# Patient Record
Sex: Male | Born: 1970 | Hispanic: No | Marital: Single | Smoking: Current every day smoker
Health system: Southern US, Community
[De-identification: ages and names within clinical notes are randomized; demographics above are authoritative.]

---

## 2000-04-02 ENCOUNTER — Emergency Department (HOSPITAL_COMMUNITY): Admission: EM | Admit: 2000-04-02 | Discharge: 2000-04-02 | Payer: Self-pay | Admitting: Emergency Medicine

## 2000-04-02 ENCOUNTER — Encounter: Payer: Self-pay | Admitting: Emergency Medicine

## 2006-03-14 ENCOUNTER — Emergency Department (HOSPITAL_COMMUNITY): Admission: EM | Admit: 2006-03-14 | Discharge: 2006-03-14 | Payer: Self-pay | Admitting: Emergency Medicine

## 2007-09-10 ENCOUNTER — Emergency Department (HOSPITAL_COMMUNITY): Admission: EM | Admit: 2007-09-10 | Discharge: 2007-09-10 | Payer: Self-pay | Admitting: Emergency Medicine

## 2007-10-07 ENCOUNTER — Ambulatory Visit: Payer: Self-pay | Admitting: Family Medicine

## 2007-10-07 DIAGNOSIS — R21 Rash and other nonspecific skin eruption: Secondary | ICD-10-CM | POA: Insufficient documentation

## 2007-10-07 DIAGNOSIS — B369 Superficial mycosis, unspecified: Secondary | ICD-10-CM | POA: Insufficient documentation

## 2008-04-27 ENCOUNTER — Ambulatory Visit: Payer: Self-pay | Admitting: Family Medicine

## 2008-04-27 ENCOUNTER — Telehealth: Payer: Self-pay | Admitting: *Deleted

## 2008-04-27 DIAGNOSIS — J029 Acute pharyngitis, unspecified: Secondary | ICD-10-CM

## 2009-09-24 ENCOUNTER — Emergency Department (HOSPITAL_COMMUNITY): Admission: EM | Admit: 2009-09-24 | Discharge: 2009-09-24 | Payer: Self-pay | Admitting: Emergency Medicine

## 2009-12-01 ENCOUNTER — Ambulatory Visit: Payer: Self-pay | Admitting: Family Medicine

## 2010-06-05 NOTE — Assessment & Plan Note (Signed)
Summary: sore throat/kh   Vital Signs:  Patient profile:   40 year old male Height:      66 inches Weight:      199 pounds BMI:     32.24 Temp:     102.8 degrees F oral Pulse rate:   121 / minute BP sitting:   96 / 58  (left arm) Cuff size:   regular  Vitals Entered By: Tessie Fass CMA (December 01, 2009 3:43 PM) CC: sore throat x 2 days   Primary Care Provider:  Marisue Ivan  MD  CC:  sore throat x 2 days.  History of Present Illness: Sore throat and fever for two days.  Allergies: No Known Drug Allergies  Physical Exam  Mouth:  exudative tonsils   Impression & Recommendations:  Problem # 1:  SORE THROAT (ICD-462) + rapid strep, treat with IM bicillin Orders: Rapid Strep-[facility] (60454) Bicillin LA 1.2 million units Injection (U9811)  Complete Medication List: 1)  Selenium Sulfide 2.5 % Lotn (Selenium sulfide) .... Apply to affected area daily x7 days  Other Orders: [facility]- Est Level  3 (91478)  Laboratory Results  Date/Time Received: December 01, 2009 3:51 PM  Date/Time Reported: December 01, 2009 3:57 PM   Other Tests  Rapid Strep: positive Comments: ...........test performed by...........Marland KitchenTerese Door, CMA      Medication Administration  Injection # 1:    Medication: Bicillin LA 1.2 million units Injection    Diagnosis: SORE THROAT (ICD-462)    Route: IM    Site: RUOQ gluteus    Exp Date: 04/2012    Lot #: [id number]    Mfr: king    Patient tolerated injection without complications    Given by: Tessie Fass CMA (December 01, 2009 4:34 PM)  Orders Added: 1)  Rapid Strep-[facility] [13086] 2)  Bicillin LA 1.2 million units Injection [J0561] 3)  [facility]- Est Level  3 [57846]

## 2010-06-24 ENCOUNTER — Encounter: Payer: Self-pay | Admitting: *Deleted

## 2010-09-10 ENCOUNTER — Ambulatory Visit: Payer: Self-pay | Admitting: Family Medicine

## 2010-09-11 ENCOUNTER — Encounter: Payer: Self-pay | Admitting: Family Medicine

## 2010-09-11 ENCOUNTER — Ambulatory Visit (INDEPENDENT_AMBULATORY_CARE_PROVIDER_SITE_OTHER): Payer: Self-pay | Admitting: Family Medicine

## 2010-09-11 DIAGNOSIS — R21 Rash and other nonspecific skin eruption: Secondary | ICD-10-CM

## 2010-09-11 DIAGNOSIS — R059 Cough, unspecified: Secondary | ICD-10-CM | POA: Insufficient documentation

## 2010-09-11 DIAGNOSIS — R05 Cough: Secondary | ICD-10-CM

## 2010-09-11 LAB — COMPREHENSIVE METABOLIC PANEL
Alkaline Phosphatase: 79 U/L (ref 39–117)
BUN: 17 mg/dL (ref 6–23)
Creat: 0.79 mg/dL (ref 0.40–1.50)
Glucose, Bld: 103 mg/dL — ABNORMAL HIGH (ref 70–99)
Total Bilirubin: 0.4 mg/dL (ref 0.3–1.2)

## 2010-09-11 MED ORDER — ALBUTEROL SULFATE HFA 108 (90 BASE) MCG/ACT IN AERS: 2.0000 | INHALATION_SPRAY | Freq: Four times a day (QID) | RESPIRATORY_TRACT | Status: DC | PRN

## 2010-09-11 MED ORDER — ALBUTEROL SULFATE (2.5 MG/3ML) 0.083% IN NEBU
2.5000 mg | INHALATION_SOLUTION | Freq: Once | RESPIRATORY_TRACT | Status: AC
  Administered 2010-09-11: 2.5 mg via RESPIRATORY_TRACT

## 2010-09-11 MED ORDER — IPRATROPIUM BROMIDE 0.02 % IN SOLN
0.5000 mg | Freq: Once | RESPIRATORY_TRACT | Status: AC
  Administered 2010-09-11: 0.5 mg via RESPIRATORY_TRACT

## 2010-09-11 MED ORDER — KETOCONAZOLE 2 % EX CREA: TOPICAL_CREAM | Freq: Every day | CUTANEOUS | Status: DC

## 2010-09-11 MED ORDER — AZITHROMYCIN 500 MG PO TABS: 500.0000 mg | ORAL_TABLET | Freq: Every day | ORAL | Status: AC

## 2010-09-11 MED ORDER — KETOCONAZOLE 2 % EX CREA: TOPICAL_CREAM | Freq: Two times a day (BID) | CUTANEOUS | Status: AC

## 2010-09-11 NOTE — Assessment & Plan Note (Addendum)
Pt has recurrence of the rash that was KOH positive as documented in [facility].  Plan to treat with an antifungal cream: ketoconazole.   Pt also has a petechial red rash on his chins, non pruritic, non-painful. Plan to monitor at this time.

## 2010-09-11 NOTE — Assessment & Plan Note (Addendum)
Pt has cough and wheezing, concern for asthma or infection. Cough is productive.  Plan to treat with antibiotics and albuterol.  Pharmacy student came in to talk to him about how to use the inhaler. Appreciate this.  Pt to follow up in 2 weeks if cough not better.

## 2010-09-11 NOTE — Progress Notes (Signed)
Rash: Pt has a rash that he has had recurrence for the last 20 years. He uses a medicine for it and it goes away and then will come back about a year later. He had a KOH done by Dr. Burnadette Pop which was positive and was treated with Selsun lotion and says that it helped. He noticed that it was coming back about 2 months ago. No itching, no burning, no new soaps, lotions or foods. Pt says his cousin has this too.  Pt also has a different rash on bilateral chins which is red, flush with the skin, tiny petechia that do not itch or burn either. Pt denies trauma to his chins.   Cough: Pt had a URI about 2 weeks ago and says he still has a cough. He is a smoker of about 1 ppd and has been smoking for 20 years. He does not complain of shortness of breath but does not like the cough. Pt wants to have something for the cough.   ROS: No fevers or chills, no h/o asthma, + smoking  PE: CV: RRR, no murmur Pulm: diffuse wheezing in all lung fields, cleared some after breathing treatment and two good coughs, but did not clear completely Skin: Pt has circular lesions on his arms and chest and back. They are red and slightly scaling. He has red petechial lesions on his chins bilaterally.

## 2010-09-11 NOTE — Patient Instructions (Signed)
Your lungs have a lot of wheezing.  I am worried that you have an infection and some wheezing from the smoking. I would encourage you to stop smoking or at least cut back.  Take the full 5 days of antibiotics and use the inhaler as directed and as needed.  Come back in or call in 2 weeks if you are not felling better.  Use the cream on your rash twice daily for 4 weeks or until it is completely gone for 5 days.

## 2010-09-12 ENCOUNTER — Other Ambulatory Visit: Payer: Self-pay | Admitting: Family Medicine

## 2010-09-12 ENCOUNTER — Telehealth: Payer: Self-pay | Admitting: Family Medicine

## 2010-09-12 DIAGNOSIS — E663 Overweight: Secondary | ICD-10-CM

## 2010-09-12 NOTE — Telephone Encounter (Signed)
Informed pt of nl labs and for him to call back and schedule a lab appt for his cholesterol. Informed him that he will need to fast for at least 8 hours prior to having this test done. Pt voiced understanding.Laureen Ochs, Viann Shove

## 2010-09-12 NOTE — Telephone Encounter (Signed)
lvm for pt to return call to inform him of the following:  let him know that everything was normal with his kidneys, liver and electrolytes. His blood sugar was just slightly elevated 103 (normal is up to 99) and I think this is because he had not been fasting for 12 hours.  I reordered his fasting cholesterol panel and he can come get it anytime he wants to make a lab appointment.
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
## 2012-09-03 ENCOUNTER — Ambulatory Visit (INDEPENDENT_AMBULATORY_CARE_PROVIDER_SITE_OTHER): Payer: Self-pay | Admitting: Family Medicine

## 2012-09-03 ENCOUNTER — Encounter: Payer: Self-pay | Admitting: Family Medicine

## 2012-09-03 VITALS — BP 118/80 | HR 98 | Temp 99.8°F | Wt 212.5 lb

## 2012-09-03 DIAGNOSIS — J020 Streptococcal pharyngitis: Secondary | ICD-10-CM

## 2012-09-03 DIAGNOSIS — J029 Acute pharyngitis, unspecified: Secondary | ICD-10-CM

## 2012-09-03 LAB — POCT RAPID STREP A (OFFICE): Rapid Strep A Screen: POSITIVE — AB

## 2012-09-03 MED ORDER — PENICILLIN G BENZATHINE 1200000 UNIT/2ML IM SUSP
1.2000 10*6.[IU] | Freq: Once | INTRAMUSCULAR | Status: AC
  Administered 2012-09-03: 1.2 10*6.[IU] via INTRAMUSCULAR

## 2012-09-03 NOTE — Progress Notes (Signed)
Subjective:    Alec Malone is a 42 y.o. male who presents to [facility] today with complaints of sore throat:  1.  Sore throat:  Present x 1 day.  Mild cough.  Very painful when swallows.  History of strep throat last year. Subjective fevers.  No nausea/vomiting.     The following portions of the patient's history were reviewed and updated as appropriate: allergies, current medications, past medical history, family and social history, and problem list. Patient is a nonsmoker.    PMH reviewed.  No past medical history on file. No past surgical history on file.  Medications reviewed. Current Outpatient Prescriptions  Medication Sig Dispense Refill  . albuterol (PROVENTIL HFA;VENTOLIN HFA) 108 (90 BASE) MCG/ACT inhaler Inhale 2 puffs into the lungs every 6 (six) hours as needed for wheezing.  1 Inhaler  5   No current facility-administered medications for this visit.    ROS as above otherwise neg.  No chest pain, palpitations, SOB, Fever, Chills, Abd pain, N/V/D.   Objective:   Physical Exam BP 118/80  Pulse 98  Temp(Src) 99.8 F (37.7 C) (Oral)  Wt 212 lb 8 oz (96.389 kg)  BMI 34.31 kg/m2 Gen:  Patient sitting on exam table, appears stated age in no acute distress Head: Normocephalic atraumatic Eyes: EOMI, PERRL, sclera and conjunctiva non-erythematous Ears:  Canals clear bilaterally.  TMs pearly gray bilaterally without erythema or bulging.   Nose:  Nasal turbinates WNL BL Mouth: Mucosa membranes moist. Posterior oropharynx with erythema and edema noted plus numerous exudates Neck: No cervical lymphadenopathy noted Heart:  RRR, no murmurs auscultated. Pulm:  Clear to auscultation bilaterally with good air movement.  No wheezes or rales noted.      No results found for this or any previous visit (from the past 72 hour(s)).

## 2012-09-03 NOTE — Patient Instructions (Signed)
Penicillin shot today.  You should feel better soon.  It was good to meet you!

## 2012-09-03 NOTE — Addendum Note (Signed)
Addended by: Farrell Ours on: 09/03/2012 05:41 PM   Modules accepted: Orders

## 2012-09-03 NOTE — Assessment & Plan Note (Signed)
Positive strep, also with positive physical exam findings. Bicillin to treat.  Has had this before, no allergy to PCN in past. FU prn no improvement or worsening.

## 2012-12-16 ENCOUNTER — Emergency Department (HOSPITAL_COMMUNITY): Payer: Self-pay

## 2012-12-16 ENCOUNTER — Encounter (HOSPITAL_COMMUNITY): Payer: Self-pay | Admitting: Emergency Medicine

## 2012-12-16 ENCOUNTER — Emergency Department (HOSPITAL_COMMUNITY)
Admission: EM | Admit: 2012-12-16 | Discharge: 2012-12-17 | Disposition: A | Discharge status: Home / Self Care | Payer: Self-pay | Attending: Emergency Medicine | Admitting: Emergency Medicine

## 2012-12-16 DIAGNOSIS — R3915 Urgency of urination: Secondary | ICD-10-CM | POA: Insufficient documentation

## 2012-12-16 DIAGNOSIS — F17200 Nicotine dependence, unspecified, uncomplicated: Secondary | ICD-10-CM | POA: Insufficient documentation

## 2012-12-16 DIAGNOSIS — R350 Frequency of micturition: Secondary | ICD-10-CM | POA: Insufficient documentation

## 2012-12-16 DIAGNOSIS — Z79899 Other long term (current) drug therapy: Secondary | ICD-10-CM | POA: Insufficient documentation

## 2012-12-16 DIAGNOSIS — R112 Nausea with vomiting, unspecified: Secondary | ICD-10-CM | POA: Insufficient documentation

## 2012-12-16 DIAGNOSIS — N23 Unspecified renal colic: Secondary | ICD-10-CM | POA: Insufficient documentation

## 2012-12-16 DIAGNOSIS — N201 Calculus of ureter: Secondary | ICD-10-CM | POA: Insufficient documentation

## 2012-12-16 DIAGNOSIS — R319 Hematuria, unspecified: Secondary | ICD-10-CM | POA: Insufficient documentation

## 2012-12-16 LAB — COMPREHENSIVE METABOLIC PANEL
ALT: 32 U/L (ref 0–53)
AST: 23 U/L (ref 0–37)
Alkaline Phosphatase: 94 U/L (ref 39–117)
CO2: 26 mEq/L (ref 19–32)
Calcium: 10.4 mg/dL (ref 8.4–10.5)
GFR calc Af Amer: >90 mL/min (ref >90)
Glucose, Bld: 103 mg/dL — ABNORMAL HIGH (ref 70–99)
Potassium: 3.7 mEq/L (ref 3.5–5.1)
Sodium: 136 mEq/L (ref 135–145)
Total Protein: 7.7 g/dL (ref 6.0–8.3)

## 2012-12-16 LAB — CBC
Hemoglobin: 16.1 g/dL (ref 13.0–17.0)
MCH: 30.3 pg (ref 26.0–34.0)
MCHC: 35.2 g/dL (ref 30.0–36.0)
Platelets: 247 10*3/uL (ref 150–400)
RBC: 5.32 MIL/uL (ref 4.22–5.81)

## 2012-12-16 LAB — URINE MICROSCOPIC-ADD ON

## 2012-12-16 LAB — URINALYSIS, ROUTINE W REFLEX MICROSCOPIC
Ketones, ur: NEGATIVE mg/dL
Specific Gravity, Urine: 1.029 (ref 1.005–1.030)
pH: 5 (ref 5.0–8.0)

## 2012-12-16 MED ORDER — KETOROLAC TROMETHAMINE 30 MG/ML IJ SOLN
30.0000 mg | Freq: Once | INTRAMUSCULAR | Status: AC
  Administered 2012-12-17: 30 mg via INTRAVENOUS
  Filled 2012-12-16: qty 1

## 2012-12-16 MED ORDER — ONDANSETRON HCL 4 MG/2ML IJ SOLN
4.0000 mg | Freq: Once | INTRAMUSCULAR | Status: AC
  Administered 2012-12-17: 4 mg via INTRAVENOUS
  Filled 2012-12-16: qty 2

## 2012-12-16 MED ORDER — TAMSULOSIN HCL 0.4 MG PO CAPS
0.4000 mg | ORAL_CAPSULE | Freq: Once | ORAL | Status: AC
  Administered 2012-12-17: 0.4 mg via ORAL
  Filled 2012-12-16: qty 1

## 2012-12-16 NOTE — ED Notes (Signed)
Pt c/o RLQ pain x 5 hours. Radiating to R groin. Denies hx of stones or hematuria. +nausea.

## 2012-12-16 NOTE — ED Provider Notes (Signed)
CSN: [id number]     Arrival date & time 12/16/12  2020 History     First MD Initiated Contact with Patient 12/16/12 2051     Chief Complaint  Patient presents with  . Abdominal Pain   (Consider location/radiation/quality/duration/timing/severity/associated sxs/prior Treatment) The history is provided by the patient and medical records. No language interpreter was used.    Alec Malone is a 42 y.o. male  with no medical Hx presents to the Emergency Department complaining of gradual, persistent, progressively worsening RLQ pain beginning at 3pm today with associated nausea, vomiting x1, and radiation into the right testicle.  Pt describes the pain as. Pt states the pain was initially intermittent, but has become constant in the last few hours.  Vomiting makes it better and deep breathing makes it worse.  Pt also endorses dysuria and hematuria.  Pt denies fever, chills, headache, neck pain, chest pain, SOB, diarrhea, weakness, dizziness, syncope.     History reviewed. No pertinent past medical history. History reviewed. No pertinent past surgical history. No family history on file. History  Substance Use Topics  . Smoking status: Current Every Day Smoker -- 1.00 packs/day for 20 years    Types: Cigarettes  . Smokeless tobacco: Not on file  . Alcohol Use: No    Review of Systems  Constitutional: Negative for fever, diaphoresis, appetite change and fatigue.  Respiratory: Negative for shortness of breath.   Cardiovascular: Negative for chest pain.  Gastrointestinal: Positive for nausea, vomiting (x1) and abdominal pain. Negative for diarrhea, constipation and blood in stool.  Genitourinary: Positive for urgency, frequency, hematuria and flank pain. Negative for dysuria and difficulty urinating.  Musculoskeletal: Negative for back pain.  Skin: Negative for rash.  Neurological: Negative for headaches.  All other systems reviewed and are negative.    Allergies  Review of patient's  allergies indicates no known allergies.  Home Medications   Current Outpatient Rx  Name  Route  Sig  Dispense  Refill  . magnesium hydroxide (MILK OF MAGNESIA) 400 MG/5ML suspension   Oral   Take 30 mL by mouth daily as needed for constipation.         . ondansetron (ZOFRAN) 8 MG tablet   Oral   Take 1 tablet (8 mg total) by mouth every 4 (four) hours as needed for nausea.   10 tablet   0   . oxyCODONE-acetaminophen (PERCOCET/ROXICET) 5-325 MG per tablet   Oral   Take 2 tablets by mouth every 4 (four) hours as needed for pain.   6 tablet   0   . tamsulosin (FLOMAX) 0.4 MG CAPS capsule   Oral   Take 1 capsule (0.4 mg total) by mouth 2 (two) times daily.   10 capsule   0    BP 135/91  Pulse 96  Temp(Src) 98.9 F (37.2 C) (Oral)  Resp 22  Ht 5\' 5"  (1.651 m)  Wt 200 lb (90.719 kg)  BMI 33.28 kg/m2  SpO2 100% Physical Exam  Nursing note and vitals reviewed. Constitutional: He is oriented to person, place, and time. He appears well-developed and well-nourished. No distress.  HENT:  Head: Normocephalic and atraumatic.  Mouth/Throat: Oropharynx is clear and moist. No oropharyngeal exudate.  Eyes: Pupils are equal, round, and reactive to light.  Neck: Normal range of motion.  Cardiovascular: Normal rate, regular rhythm, normal heart sounds and intact distal pulses.   No murmur heard. Pulmonary/Chest: Effort normal and breath sounds normal. No respiratory distress. He has no wheezes. He has  no rales. He exhibits no tenderness.  Abdominal: Soft. Bowel sounds are normal. He exhibits no distension and no mass. There is no tenderness. There is CVA tenderness (right flank). There is no rigidity, no rebound and no guarding. Hernia confirmed negative in the right inguinal area and confirmed negative in the left inguinal area.  Pt endorses RLQ abd pain, but is soft and nontender on exam   Genitourinary: Testes normal. Cremasteric reflex is present. Right testis shows no mass, no  swelling and no tenderness. Right testis is descended. Cremasteric reflex is not absent on the right side. Left testis shows no mass, no swelling and no tenderness. Left testis is descended. Cremasteric reflex is not absent on the left side. Circumcised. No phimosis, paraphimosis, hypospadias, penile erythema or penile tenderness. No discharge found.  Musculoskeletal: Normal range of motion. He exhibits no edema.  Lymphadenopathy:    He has no cervical adenopathy.       Right: No inguinal adenopathy present.       Left: No inguinal adenopathy present.  Neurological: He is alert and oriented to person, place, and time. He exhibits normal muscle tone. Coordination normal.  Skin: Skin is warm and dry. No rash noted. He is not diaphoretic. No erythema.  Psychiatric: He has a normal mood and affect.    ED Course   Procedures (including critical care time)  Labs Reviewed  CBC - Abnormal; Notable for the following:    WBC 13.1 (*)    All other components within normal limits  COMPREHENSIVE METABOLIC PANEL - Abnormal; Notable for the following:    Glucose, Bld 103 (*)    GFR calc non Af Amer 82 (*)    All other components within normal limits  URINALYSIS, ROUTINE W REFLEX MICROSCOPIC - Abnormal; Notable for the following:    Color, Urine AMBER (*)    APPearance CLOUDY (*)    Hgb urine dipstick LARGE (*)    Bilirubin Urine SMALL (*)    Protein, ur 30 (*)    Leukocytes, UA SMALL (*)    All other components within normal limits  URINE MICROSCOPIC-ADD ON - Abnormal; Notable for the following:    Casts HYALINE CASTS (*)    Crystals CA OXALATE CRYSTALS (*)    All other components within normal limits   Ct Abdomen Pelvis Wo Contrast  12/16/2012   *RADIOLOGY REPORT*  Clinical Data: Right lower quadrant abdominal pain and hematuria  CT ABDOMEN AND PELVIS WITHOUT CONTRAST  Technique:  Multidetector CT imaging of the abdomen and pelvis was performed following the standard protocol without  intravenous contrast.  Comparison: None.  Findings: There is mild right hydroureteronephrosis to the level of a 2 mm distal right ureteral calculus image 80.  Mild right perinephric stranding is noted without focal fluid collection. Unenhanced liver, gallbladder, adrenal glands, pancreas, left kidney, and spleen are unremarkable.  No ascites or lymphadenopathy.  Appendix and bowel are normal.  Small fat containing bilateral inguinal hernias.  Bladder unremarkable.  No acute osseous abnormality.  IMPRESSION: 2 mm distal right ureteral calculus producing mild prominence of the right infrarenal collecting system and ureter.   Original Report Authenticated By: Christiana Pellant, M.D.   1. Ureteral calculus   2. Renal colic on right side     MDM  Federico Flake presents with symptoms of renal colic, but no hx of nephrolithiasis.  Pt given fluid.  Pain has resolved by the time of evaluation.  Urine is grossly bloody.  No evidence of testicular  torsion or orchitis/epididimytitis.  Will obtain basic labs and ct non contrast to assess for ureteral stones.    Pt has been diagnosed with a Kidney Stone via CT. There is no evidence of significant hydronephrosis, serum creatine WNL, vitals sign stable and the pt does not have irratractable vomiting. No evidence of infected stone on UA.  Pt resting comfortably at this time and is tolerating PO.  Pt will be dc home with pain medications & has been advised to follow up with PCP and urology. I have also discussed reasons to return immediately to the ER.  Patient expresses understanding and agrees with plan.     Dahlia Client Posey Petrik, PA-C 12/17/12 0007

## 2012-12-17 MED ORDER — OXYCODONE-ACETAMINOPHEN 5-325 MG PO TABS: 2.0000 | ORAL_TABLET | ORAL | Status: DC | PRN

## 2012-12-17 MED ORDER — TAMSULOSIN HCL 0.4 MG PO CAPS: 0.4000 mg | ORAL_CAPSULE | Freq: Two times a day (BID) | ORAL | Status: AC

## 2012-12-17 MED ORDER — ONDANSETRON HCL 8 MG PO TABS: 8.0000 mg | ORAL_TABLET | ORAL | Status: AC | PRN

## 2012-12-19 NOTE — ED Provider Notes (Signed)
Medical screening examination/treatment/procedure(s) were performed by non-physician practitioner and as supervising physician I was immediately available for consultation/collaboration.   Gerrod Maule L Aidan Moten, MD 12/19/12 0136 

## 2015-08-24 ENCOUNTER — Emergency Department (HOSPITAL_COMMUNITY)
Admission: EM | Admit: 2015-08-24 | Discharge: 2015-08-24 | Disposition: A | Discharge status: Home / Self Care | Payer: Self-pay | Attending: Emergency Medicine | Admitting: Emergency Medicine

## 2015-08-24 ENCOUNTER — Emergency Department (HOSPITAL_COMMUNITY): Payer: Self-pay

## 2015-08-24 ENCOUNTER — Encounter (HOSPITAL_COMMUNITY): Payer: Self-pay

## 2015-08-24 DIAGNOSIS — W3189XA Contact with other specified machinery, initial encounter: Secondary | ICD-10-CM | POA: Insufficient documentation

## 2015-08-24 DIAGNOSIS — Y939 Activity, unspecified: Secondary | ICD-10-CM | POA: Insufficient documentation

## 2015-08-24 DIAGNOSIS — F17210 Nicotine dependence, cigarettes, uncomplicated: Secondary | ICD-10-CM | POA: Insufficient documentation

## 2015-08-24 DIAGNOSIS — Y999 Unspecified external cause status: Secondary | ICD-10-CM | POA: Insufficient documentation

## 2015-08-24 DIAGNOSIS — Y929 Unspecified place or not applicable: Secondary | ICD-10-CM | POA: Insufficient documentation

## 2015-08-24 DIAGNOSIS — Z79891 Long term (current) use of opiate analgesic: Secondary | ICD-10-CM | POA: Insufficient documentation

## 2015-08-24 DIAGNOSIS — Z23 Encounter for immunization: Secondary | ICD-10-CM | POA: Insufficient documentation

## 2015-08-24 DIAGNOSIS — S61214A Laceration without foreign body of right ring finger without damage to nail, initial encounter: Secondary | ICD-10-CM | POA: Insufficient documentation

## 2015-08-24 MED ORDER — HYDROCODONE-ACETAMINOPHEN 5-325 MG PO TABS: 1.0000 | ORAL_TABLET | Freq: Four times a day (QID) | ORAL | Status: AC | PRN

## 2015-08-24 MED ORDER — LIDOCAINE HCL 2 % IJ SOLN
10.0000 mL | Freq: Once | INTRAMUSCULAR | Status: AC
  Administered 2015-08-24: 200 mg via INTRADERMAL
  Filled 2015-08-24: qty 20

## 2015-08-24 MED ORDER — TETANUS-DIPHTH-ACELL PERTUSSIS 5-2.5-18.5 LF-MCG/0.5 IM SUSP
0.5000 mL | Freq: Once | INTRAMUSCULAR | Status: AC
Start: 2015-08-24 — End: 2015-08-24
  Administered 2015-08-24: 0.5 mL via INTRAMUSCULAR
  Filled 2015-08-24: qty 0.5

## 2015-08-24 NOTE — ED Provider Notes (Signed)
History  By signing my name below, I, Karle Plumber, attest that this documentation has been prepared under the direction and in the presence of Fayrene Helper, PA-C. Electronically Signed: Karle Plumber, ED Scribe. 08/24/2015. 2:32 PM.  Chief Complaint  Patient presents with  . Finger Injury   The history is provided by the patient and medical records. No language interpreter was used.    HPI Comments:  Alec Malone is a 45 y.o. male who presents to the Emergency Department complaining of a laceration to the fourth right digit that occurred about 1.5 hours ago. He states he lacerated the finger on a running fan of a started car. He reports associated pain, swelling and bleeding that he has controlled with a bandage. He denies modifying factors. He denies numbness, tingling or weakness of the right fourth finger or right hand, nausea, vomiting, fever or chills. He is right hand dominant. He is unsure of last tetanus vaccination.  History reviewed. No pertinent past medical history. History reviewed. No pertinent past surgical history. History reviewed. No pertinent family history. Social History  Substance Use Topics  . Smoking status: Current Every Day Smoker -- 1.00 packs/day for 20 years    Types: Cigarettes  . Smokeless tobacco: None  . Alcohol Use: No    Review of Systems  Constitutional: Negative for fever and chills.  Gastrointestinal: Negative for nausea and vomiting.  Musculoskeletal: Positive for arthralgias.  Skin: Positive for wound.  Neurological: Negative for weakness and numbness.    Allergies  Review of patient's allergies indicates no known allergies.  Home Medications   Prior to Admission medications   Medication Sig Start Date End Date Taking? Authorizing Provider  magnesium hydroxide (MILK OF MAGNESIA) 400 MG/5ML suspension Take 30 mL by mouth daily as needed for constipation.    Historical Provider, MD  ondansetron (ZOFRAN) 8 MG tablet Take 1 tablet  (8 mg total) by mouth every 4 (four) hours as needed for nausea. 12/17/12   Hannah Muthersbaugh, PA-C  oxyCODONE-acetaminophen (PERCOCET/ROXICET) 5-325 MG per tablet Take 2 tablets by mouth every 4 (four) hours as needed for pain. 12/17/12   Hannah Muthersbaugh, PA-C  tamsulosin (FLOMAX) 0.4 MG CAPS capsule Take 1 capsule (0.4 mg total) by mouth 2 (two) times daily. 12/17/12   Hannah Muthersbaugh, PA-C   Triage Vitals: BP 140/95 mmHg  Pulse 91  Temp(Src) 98.2 F (36.8 C) (Oral)  Resp 14  Ht  (1.651 m)  Wt 192 lb (87.091 kg)  BMI 31.95 kg/m2  SpO2 99% Physical Exam  Constitutional: He is oriented to person, place, and time. He appears well-developed and well-nourished.  HENT:  Head: Normocephalic and atraumatic.  Eyes: EOM are normal.  Neck: Normal range of motion.  Cardiovascular: Normal rate.   Right radial pulse intact  Pulmonary/Chest: Effort normal.  Musculoskeletal: Normal range of motion.  Less than 1 cm laceration to distal phalanx of fourth digit of right hand with no joint involvement Right hand nontender to palpation Small, superficial skin tear on distal fifth digit No bony deformities  Neurological: He is alert and oriented to person, place, and time.  Sensations intact. Distally NVI.  Skin: Skin is warm and dry.  Psychiatric: He has a normal mood and affect. His behavior is normal.  Nursing note and vitals reviewed.   ED Course  Procedures (including critical care time) DIAGNOSTIC STUDIES: Oxygen Saturation is 99% on RA, normal by my interpretation.   COORDINATION OF CARE: 2:30 PM- Will update tetanus vaccination and suture  wound. Pt verbalizes understanding and agrees to plan.  LACERATION REPAIR PROCEDURE NOTE The patient's identification was confirmed and consent was obtained. This procedure was performed by Fayrene HelperBowie Honour Schwieger, PA-C  at 2:32 PM. Site: distal phalanx of right fourth digit Sterile procedures observed Anesthetic used (type and amt): Lidocaine 2%  without Epinephrine (5 mLs) digital block Suture type/size: 5-0 Prolene Length: less than 1 cm # of Sutures: none Technique: exploration of wound, surgical debridement of devitalized tissue Complexity: simple Antibx ointment applied Tetanus ordered Site anesthetized, irrigated with NS, explored without evidence of foreign body, wound well approximated, site covered with dry, sterile dressing.  Patient tolerated procedure well without complications. Instructions for care discussed verbally and patient provided with additional written instructions for homecare and f/u.  Medications  Tdap (BOOSTRIX) injection 0.5 mL (not administered)  lidocaine (XYLOCAINE) 2 % (with pres) injection 200 mg (200 mg Intradermal Given 08/24/15 1418)   Imaging Review Dg Finger Ring Right  08/24/2015  CLINICAL DATA:  Cut fourth digit on fan today, initial encounter EXAM: RIGHT RING FINGER 2+V COMPARISON:  None. FINDINGS: Soft tissue irregularity is noted in the distal fourth digit consistent with the patient's given clinical history. No radiopaque foreign body is noted. No bony abnormality is seen. IMPRESSION: Soft tissue abnormality without acute bony abnormality. Electronically Signed   By: Alcide CleverMark  Lukens M.D.   On: 08/24/2015 14:02   I have personally reviewed and evaluated these images and lab results as part of my medical decision-making.   MDM   Final diagnoses:  Laceration of right ring finger w/o foreign body w/o damage to nail, initial encounter    BP 140/95 mmHg  Pulse 91  Temp(Src) 98.2 F (36.8 C) (Oral)  Resp 14  Ht 5\' 5"  (1.651 m)  Wt 87.091 kg  BMI 31.95 kg/m2  SpO2 99%   I personally performed the services described in this documentation, which was scribed in my presence. The recorded information has been reviewed and is accurate.       Fayrene HelperBowie Clark Clowdus, PA-C 08/24/15 1510  Pricilla LovelessScott Goldston, MD 08/26/15 [phone]

## 2015-08-24 NOTE — Discharge Instructions (Signed)
Please check on your wound and change dressing daily.  Remember to apply neosporin cream to the wound for wound care.  Keep an eye out for signs of infection.  Take pain medication as needed but be aware that it can cause drowsiness.  Return if you notice signs of infection.   Nonsutured Laceration Care A laceration is a cut that goes through all layers of the skin and extends into the tissue that is right under the skin. This type of cut is usually stitched up (sutured) or closed with tape (adhesive strips) or skin glue shortly after the injury happens. However, if the wound is dirty or if several hours pass before medical treatment is provided, it is likely that germs (bacteria) will enter the wound. Closing a laceration after bacteria have entered it increases the risk of infection. In these cases, your health care provider may leave the laceration open (nonsutured) and cover it with a bandage. This type of treatment helps prevent infection and allows the wound to heal from the deepest layer of tissue damage up to the surface. An open fracture is a type of injury that may involve nonsutured lacerations. An open fracture is a break in a bone that happens along with one or more lacerations through the skin that is near the fracture site. HOW TO CARE FOR YOUR NONSUTURED LACERATION  Take or apply over-the-counter and prescription medicines only as told by your health care provider.  If you were prescribed an antibiotic medicine, take or apply it as told by your health care provider. Do not stop using the antibiotic even if your condition improves.  Clean the wound one time each day or as told by your health care provider.  Wash the wound with mild soap and water.  Rinse the wound with water to remove all soap.  Pat your wound dry with a clean towel. Do not rub the wound.  Do not inject anything into the wound unless your health care provider told you to.  Change any bandages (dressings) as told  by your health care provider. This includes changing the dressing if it gets wet, dirty, or starts to smell bad.  Keep the dressing dry until your health care provider says it can be removed. Do not take baths, swim, or do anything that puts your wound underwater until your health care provider approves.  Raise (elevate) the injured area above the level of your heart while you are sitting or lying down, if possible.  Do not scratch or pick at the wound.  Check your wound every day for signs of infection. Watch for:  Redness, swelling, or pain.  Fluid, blood, or pus.  Keep all follow-up visits as told by your health care provider. This is important. SEEK MEDICAL CARE IF:  You received a tetanus and shot and you have swelling, severe pain, redness, or bleeding at the injection site.   You have a fever.  Your pain is not controlled with medicine.  You have increased redness, swelling, or pain at the site of your wound.  You have fluid, blood, or pus coming from your wound.  You notice a bad smell coming from your wound or your dressing.  You notice something coming out of the wound, such as wood or glass.  You notice a change in the color of your skin near your wound.  You develop a new rash.  You need to change the dressing frequently due to fluid, blood, or pus draining from the wound.  You develop numbness around your wound. SEEK IMMEDIATE MEDICAL CARE IF:  Your pain suddenly increases and is severe.  You develop severe swelling around the wound.  The wound is on your hand or foot and you cannot properly move a finger or toe.  The wound is on your hand or foot and you notice that your fingers or toes look pale or bluish.  You have a red streak going away from your wound.   This information is not intended to replace advice given to you by your health care provider. Make sure you discuss any questions you have with your health care provider.   Document Released:  03/20/2006 Document Revised: 09/06/2014 Document Reviewed: 04/18/2014 [employer] Interactive Patient Education [employer]

## 2015-08-24 NOTE — ED Notes (Signed)
Pt cut right ring finger with plastic fan.  Bleeding controlled with bandage.

## 2017-04-29 IMAGING — CR DG FINGER RING 2+V*R*
4 series · 4 of 4 positions shown · non-contrast
Comparison: None.

CLINICAL DATA: Cut fourth digit on fan today, initial encounter

EXAM:
RIGHT RING FINGER 2+V

[x finger pa right]
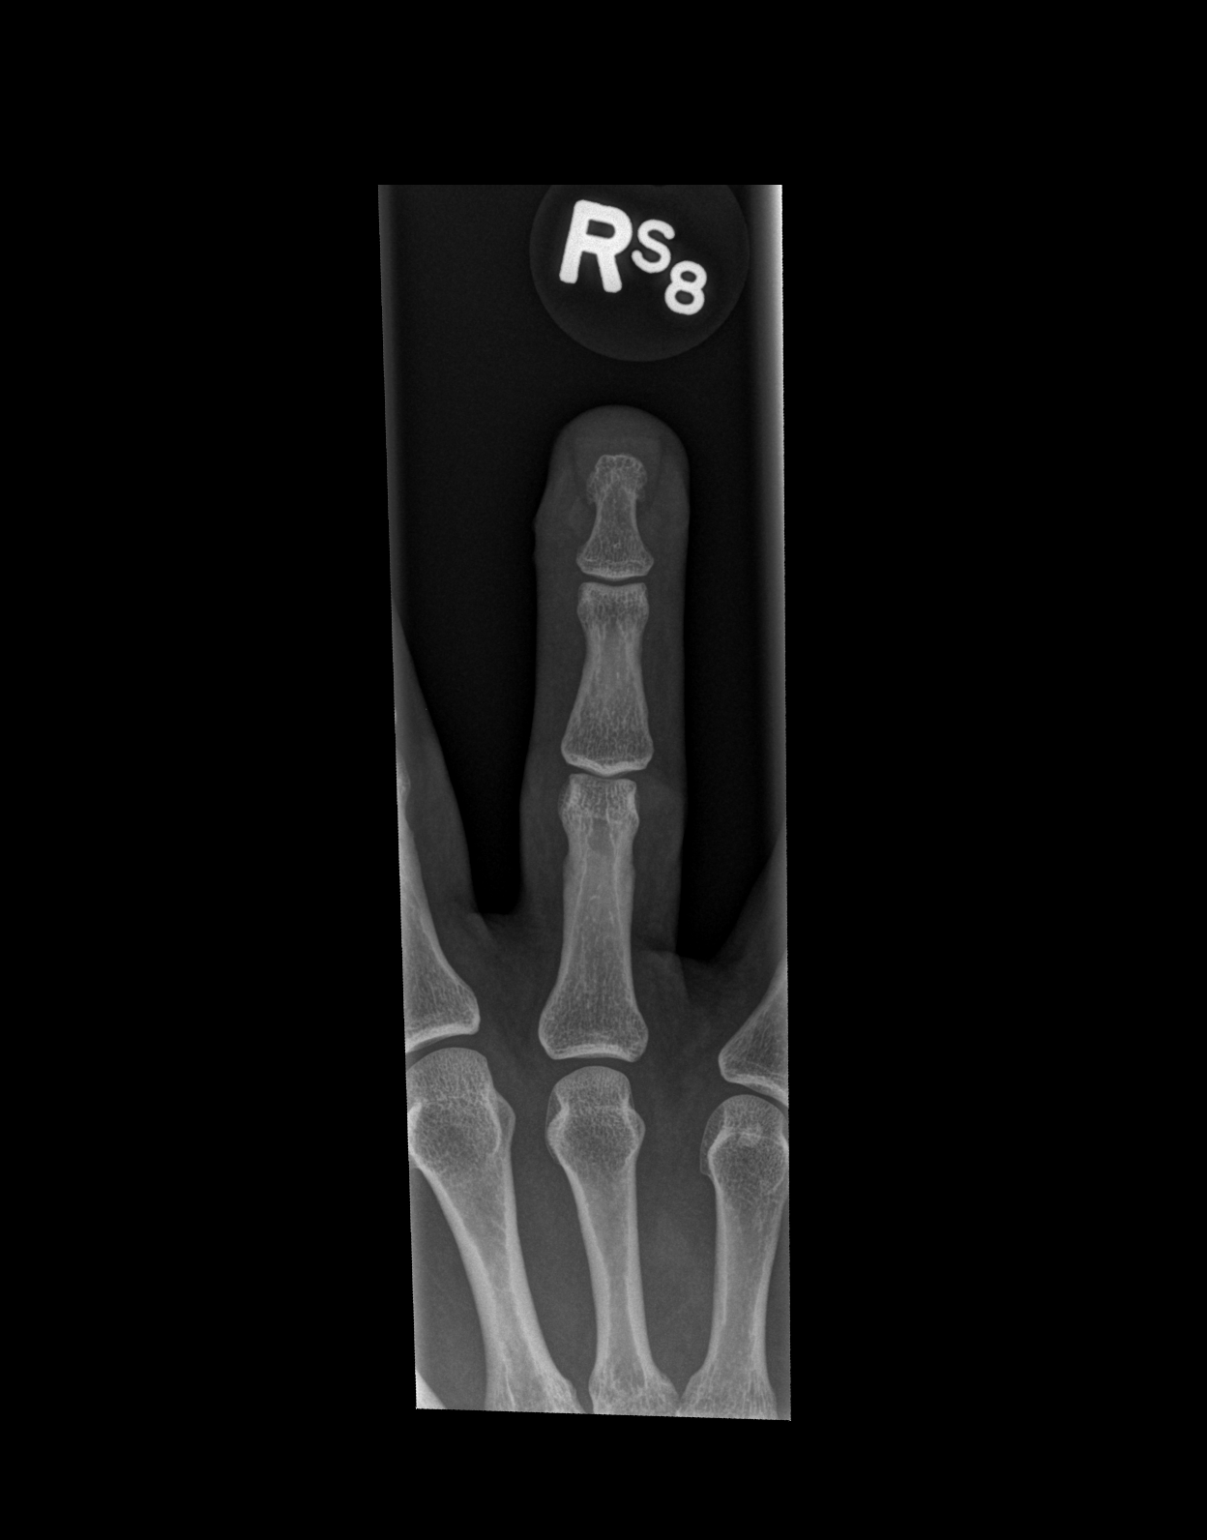

[x finger obl right]
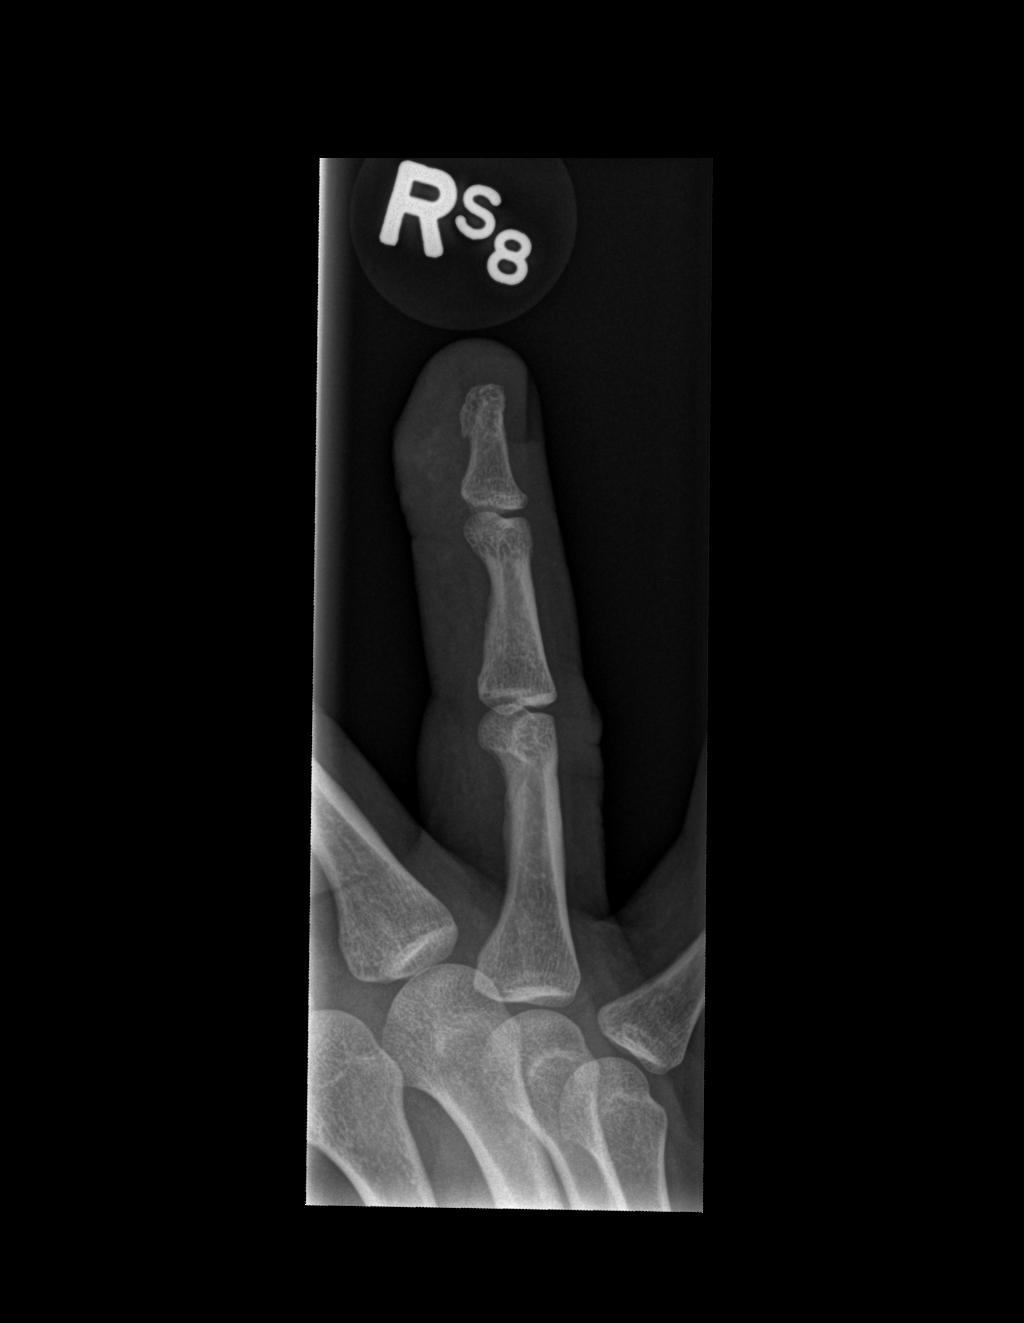

[x finger lat right (1 of 2)]
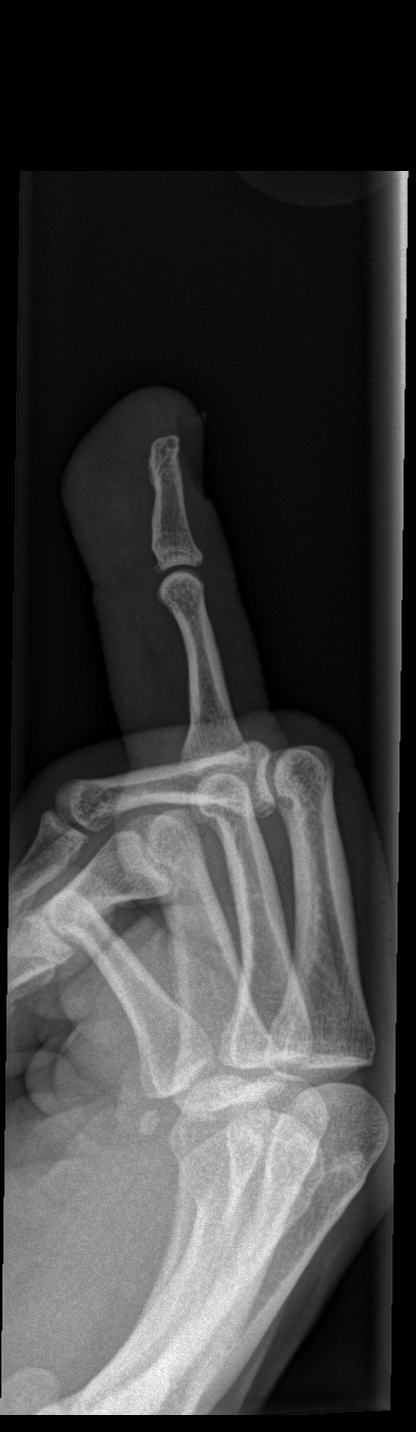

[x finger lat right (2 of 2)]
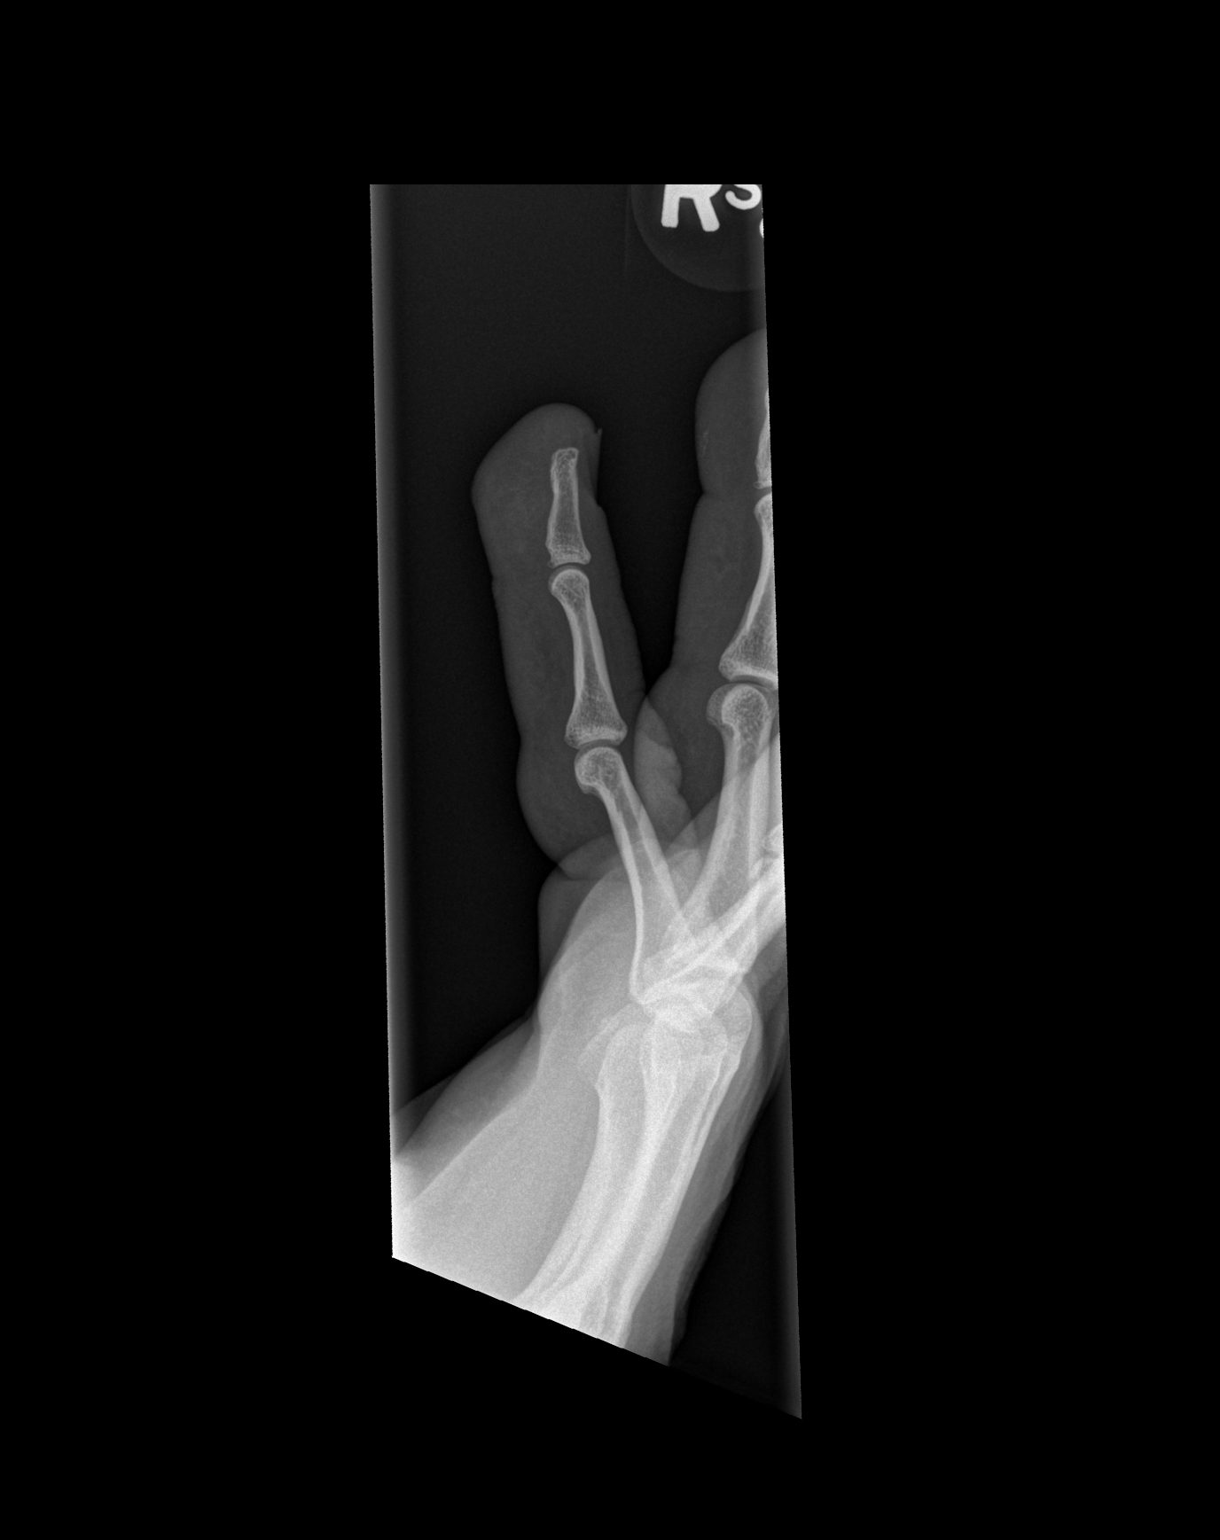

[4 of 4 positions shown; findings below may reference images not displayed]

FINDINGS: Soft tissue irregularity is noted in the distal fourth digit
consistent with the patient's given clinical history. No radiopaque
foreign body is noted. No bony abnormality is seen.
IMPRESSION: Soft tissue abnormality without acute bony abnormality.

## 2019-06-01 ENCOUNTER — Ambulatory Visit: Payer: Self-pay | Attending: Internal Medicine
